# Patient Record
Sex: Male | Born: 2000 | Race: Black or African American | Hispanic: No | Marital: Single | Smoking: Never smoker
Health system: Southern US, Community
[De-identification: ages and names within clinical notes are randomized; demographics above are authoritative.]

## PROBLEM LIST (undated history)

## (undated) DIAGNOSIS — S060XAA Concussion with loss of consciousness status unknown, initial encounter: Secondary | ICD-10-CM

## (undated) HISTORY — PX: NO PAST SURGERIES: SHX2092

---
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
## 2021-12-11 ENCOUNTER — Other Ambulatory Visit (HOSPITAL_COMMUNITY): Payer: Self-pay | Admitting: Family Medicine

## 2021-12-11 ENCOUNTER — Other Ambulatory Visit: Payer: Self-pay | Admitting: Family Medicine

## 2021-12-11 ENCOUNTER — Ambulatory Visit (HOSPITAL_COMMUNITY): Payer: PRIVATE HEALTH INSURANCE

## 2021-12-11 DIAGNOSIS — S63649D Sprain of metacarpophalangeal joint of unspecified thumb, subsequent encounter: Secondary | ICD-10-CM

## 2021-12-14 ENCOUNTER — Encounter (HOSPITAL_COMMUNITY): Payer: Self-pay

## 2021-12-14 ENCOUNTER — Other Ambulatory Visit (HOSPITAL_COMMUNITY): Payer: PRIVATE HEALTH INSURANCE

## 2021-12-14 ENCOUNTER — Ambulatory Visit (HOSPITAL_COMMUNITY)
Admission: RE | Admit: 2021-12-14 | Discharge: 2021-12-14 | Disposition: A | Discharge status: Home / Self Care | Payer: PRIVATE HEALTH INSURANCE | Source: Ambulatory Visit | Attending: Family Medicine | Admitting: Family Medicine

## 2021-12-14 DIAGNOSIS — S63649D Sprain of metacarpophalangeal joint of unspecified thumb, subsequent encounter: Secondary | ICD-10-CM

## 2021-12-15 ENCOUNTER — Ambulatory Visit (HOSPITAL_COMMUNITY)
Admission: RE | Admit: 2021-12-15 | Discharge: 2021-12-15 | Disposition: A | Discharge status: Home / Self Care | Payer: No Typology Code available for payment source | Source: Ambulatory Visit | Attending: Family Medicine | Admitting: Family Medicine

## 2021-12-15 DIAGNOSIS — S63649D Sprain of metacarpophalangeal joint of unspecified thumb, subsequent encounter: Secondary | ICD-10-CM | POA: Insufficient documentation

## 2022-01-11 ENCOUNTER — Other Ambulatory Visit: Payer: Self-pay | Admitting: Orthopedic Surgery

## 2022-01-15 ENCOUNTER — Other Ambulatory Visit: Payer: Self-pay

## 2022-01-15 ENCOUNTER — Encounter (HOSPITAL_BASED_OUTPATIENT_CLINIC_OR_DEPARTMENT_OTHER): Payer: Self-pay | Admitting: Orthopedic Surgery

## 2022-01-21 NOTE — H&P (Signed)
History: CC / Reason for Visit: Left thumb HPI: This patient is a 21 year old, right-hand-dominant, male athlete who was playing soccer, slipped on the ground and landed injuring his left thumb.  He had x-rays which demonstrated no significant findings, but continued to have discomfort.  He was then sent on 12/15/2021 for an MRI of the left hand which indicated that he has a left thumb MCP UCL complete rupture with Stener lesion.  He is here today along with his dad to discuss surgical options.  The patient also was planning to play basketball in college and would need to be able to participate by end of October early November.  Past medical history, past surgical history, family history, social history, medications, allergies and review of systems are thoroughly reviewed by me, signed and scanned into SRS today.  The patient is otherwise healthy.  Exam:  Vitals: Refer to EMR. Constitutional:  WD, WN, NAD HEENT:  NCAT, EOMI Neuro/Psych:  Alert & oriented to person, place, and time; appropriate mood & affect Lymphatic: No generalized UE edema or lymphadenopathy Extremities / MSK:  Both UE are normal with respect to appearance, ranges of motion, joint stability, muscle strength/tone, sensation, & perfusion except as otherwise noted:  Left thumb is very swollen and mildly discolored still.  There is tenderness to palpation directly over the left MP joint line with laxity with radial stress applied gently.  He does have full IP range of motion.  The remaining digits are also full motion without swelling.    Labs / Xrays:  No radiographic studies obtained today.  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  Ultimately the patient as well as his father want to move forward with repair versus reconstruction of the left thumb MP UCL tear.  We discussed details  of the procedure, to include the use of the internal brace, likely not requiring autograft.  We discussed the advantages and disadvantages of proceeding more rapidly versus perhaps on the first day of September, as there are logistical reasons for them that that may be preferable.  I indicated it would not be optimal, but since it is already subacute, may not offer a lot of added difficulty or complication.  They will consider what they like to do and we will plan accordingly.    The details of the operative procedure were discussed with the patient.  Questions were invited and answered.  In addition to the goal of the procedure, the risks of the procedure to include but not limited to bleeding; infection; damage to the nerves or blood vessels that could result in bleeding, numbness, weakness, chronic pain, and the need for additional procedures; stiffness; the need for revision surgery; and anesthetic risks were reviewed.  No specific outcome was guaranteed or implied.  Informed consent was obtained.

## 2022-01-21 NOTE — Anesthesia Preprocedure Evaluation (Addendum)
Anesthesia Evaluation  Patient identified by MRN, date of birth, ID band Patient awake    Reviewed: Allergy & Precautions, NPO status , Patient's Chart, lab work & pertinent test results  Airway Mallampati: I       Dental no notable dental hx.    Pulmonary neg pulmonary ROS,    Pulmonary exam normal        Cardiovascular negative cardio ROS Normal cardiovascular exam     Neuro/Psych negative neurological ROS  negative psych ROS   GI/Hepatic negative GI ROS, Neg liver ROS,   Endo/Other  negative endocrine ROS  Renal/GU negative Renal ROS  negative genitourinary   Musculoskeletal   Abdominal Normal abdominal exam  (+)   Peds  Hematology   Anesthesia Other Findings   Reproductive/Obstetrics                            Anesthesia Physical Anesthesia Plan  ASA: 1  Anesthesia Plan: MAC and Regional   Post-op Pain Management: Regional block*   Induction:   PONV Risk Score and Plan: 1 and Ondansetron, Midazolam and Propofol infusion  Airway Management Planned: Natural Airway, Simple Face Mask and Nasal Cannula  Additional Equipment: None  Intra-op Plan:   Post-operative Plan:   Informed Consent: I have reviewed the patients History and Physical, chart, labs and discussed the procedure including the risks, benefits and alternatives for the proposed anesthesia with the patient or authorized representative who has indicated his/her understanding and acceptance.       Plan Discussed with: CRNA  Anesthesia Plan Comments:        Anesthesia Quick Evaluation

## 2022-01-22 ENCOUNTER — Ambulatory Visit (HOSPITAL_BASED_OUTPATIENT_CLINIC_OR_DEPARTMENT_OTHER): Payer: No Typology Code available for payment source | Admitting: Certified Registered"

## 2022-01-22 ENCOUNTER — Ambulatory Visit (HOSPITAL_COMMUNITY): Payer: No Typology Code available for payment source

## 2022-01-22 ENCOUNTER — Encounter (HOSPITAL_BASED_OUTPATIENT_CLINIC_OR_DEPARTMENT_OTHER)
Admission: RE | Disposition: A | Discharge status: Home / Self Care | Payer: Self-pay | Source: Home / Self Care | Attending: Orthopedic Surgery

## 2022-01-22 ENCOUNTER — Encounter (HOSPITAL_BASED_OUTPATIENT_CLINIC_OR_DEPARTMENT_OTHER): Payer: Self-pay | Admitting: Orthopedic Surgery

## 2022-01-22 ENCOUNTER — Ambulatory Visit (HOSPITAL_BASED_OUTPATIENT_CLINIC_OR_DEPARTMENT_OTHER)
Admission: RE | Admit: 2022-01-22 | Discharge: 2022-01-22 | Disposition: A | Discharge status: Home / Self Care | Payer: No Typology Code available for payment source | Attending: Orthopedic Surgery | Admitting: Orthopedic Surgery

## 2022-01-22 ENCOUNTER — Other Ambulatory Visit: Payer: Self-pay

## 2022-01-22 DIAGNOSIS — S5332XA Traumatic rupture of left ulnar collateral ligament, initial encounter: Secondary | ICD-10-CM | POA: Diagnosis not present

## 2022-01-22 DIAGNOSIS — S63642A Sprain of metacarpophalangeal joint of left thumb, initial encounter: Secondary | ICD-10-CM | POA: Insufficient documentation

## 2022-01-22 DIAGNOSIS — Y9366 Activity, soccer: Secondary | ICD-10-CM | POA: Insufficient documentation

## 2022-01-22 DIAGNOSIS — W010XXA Fall on same level from slipping, tripping and stumbling without subsequent striking against object, initial encounter: Secondary | ICD-10-CM | POA: Insufficient documentation

## 2022-01-22 HISTORY — PX: LIGAMENT REPAIR: SHX5444

## 2022-01-22 HISTORY — DX: Concussion with loss of consciousness status unknown, initial encounter: S06.0XAA

## 2022-01-22 SURGERY — REPAIR, LIGAMENT
Anesthesia: Monitor Anesthesia Care | Site: Thumb | Laterality: Left

## 2022-01-22 MED ORDER — DEXAMETHASONE SODIUM PHOSPHATE 10 MG/ML IJ SOLN
INTRAMUSCULAR | Status: DC | PRN
  Administered 2022-01-22: 10 mg

## 2022-01-22 MED ORDER — PROPOFOL 500 MG/50ML IV EMUL
INTRAVENOUS | Status: DC | PRN
  Administered 2022-01-22: 12 ug/kg/min via INTRAVENOUS

## 2022-01-22 MED ORDER — MIDAZOLAM HCL 2 MG/2ML IJ SOLN
INTRAMUSCULAR | Status: AC
  Filled 2022-01-22: qty 2

## 2022-01-22 MED ORDER — OXYCODONE HCL 5 MG PO TABS: 5.0000 mg | ORAL_TABLET | Freq: Four times a day (QID) | ORAL | 0 refills | Status: AC | PRN

## 2022-01-22 MED ORDER — MIDAZOLAM HCL 2 MG/2ML IJ SOLN
2.0000 mg | Freq: Once | INTRAMUSCULAR | Status: AC
Start: 2022-01-22 — End: 2022-01-22
  Administered 2022-01-22: 2 mg via INTRAVENOUS

## 2022-01-22 MED ORDER — IBUPROFEN 200 MG PO TABS: 600.0000 mg | ORAL_TABLET | Freq: Four times a day (QID) | ORAL | Status: AC

## 2022-01-22 MED ORDER — ROPIVACAINE HCL 7.5 MG/ML IJ SOLN
INTRAMUSCULAR | Status: DC | PRN
  Administered 2022-01-22 (×4): 5 mL via PERINEURAL

## 2022-01-22 MED ORDER — FENTANYL CITRATE (PF) 100 MCG/2ML IJ SOLN
100.0000 ug | Freq: Once | INTRAMUSCULAR | Status: AC
  Administered 2022-01-22: 100 ug via INTRAVENOUS

## 2022-01-22 MED ORDER — FENTANYL CITRATE (PF) 100 MCG/2ML IJ SOLN
INTRAMUSCULAR | Status: AC
  Filled 2022-01-22: qty 2

## 2022-01-22 MED ORDER — CEFAZOLIN SODIUM-DEXTROSE 2-4 GM/100ML-% IV SOLN
INTRAVENOUS | Status: AC
  Filled 2022-01-22: qty 100

## 2022-01-22 MED ORDER — LACTATED RINGERS IV SOLN: INTRAVENOUS | Status: DC

## 2022-01-22 MED ORDER — LIDOCAINE 2% (20 MG/ML) 5 ML SYRINGE
INTRAMUSCULAR | Status: DC | PRN
  Administered 2022-01-22: 30 mg via INTRAVENOUS

## 2022-01-22 MED ORDER — DEXMEDETOMIDINE (PRECEDEX) IN NS 20 MCG/5ML (4 MCG/ML) IV SYRINGE
PREFILLED_SYRINGE | INTRAVENOUS | Status: DC | PRN
  Administered 2022-01-22: 12 ug via INTRAVENOUS

## 2022-01-22 MED ORDER — ACETAMINOPHEN 325 MG PO TABS: 650.0000 mg | ORAL_TABLET | Freq: Four times a day (QID) | ORAL | Status: AC

## 2022-01-22 MED ORDER — CEFAZOLIN SODIUM-DEXTROSE 2-4 GM/100ML-% IV SOLN
2.0000 g | INTRAVENOUS | Status: AC
  Administered 2022-01-22: 2 g via INTRAVENOUS

## 2022-01-22 MED ORDER — ONDANSETRON HCL 4 MG/2ML IJ SOLN
INTRAMUSCULAR | Status: DC | PRN
  Administered 2022-01-22: 4 mg via INTRAVENOUS

## 2022-01-22 MED ORDER — CLONIDINE HCL (ANALGESIA) 100 MCG/ML EP SOLN
EPIDURAL | Status: DC | PRN
  Administered 2022-01-22: 100 ug

## 2022-01-22 SURGICAL SUPPLY — 69 items
ANCHOR REPAIR HAND WRIST (Anchor) IMPLANT
APL PRP STRL LF DISP 70% ISPRP (MISCELLANEOUS) ×1
BLADE MINI RND TIP GREEN BEAV (BLADE) IMPLANT
BLADE SURG 15 STRL LF DISP TIS (BLADE) ×1 IMPLANT
BLADE SURG 15 STRL SS (BLADE) ×2
BNDG CMPR 5X4 CHSV STRCH STRL (GAUZE/BANDAGES/DRESSINGS) ×1
BNDG CMPR 9X4 STRL LF SNTH (GAUZE/BANDAGES/DRESSINGS) ×1
BNDG COHESIVE 4X5 TAN STRL LF (GAUZE/BANDAGES/DRESSINGS) ×1 IMPLANT
BNDG ESMARK 4X9 LF (GAUZE/BANDAGES/DRESSINGS) ×1 IMPLANT
BNDG GAUZE DERMACEA FLUFF 4 (GAUZE/BANDAGES/DRESSINGS) ×1 IMPLANT
BNDG GZE DERMACEA 4 6PLY (GAUZE/BANDAGES/DRESSINGS) ×1
BRUSH SCRUB EZ PLAIN DRY (MISCELLANEOUS) IMPLANT
CHLORAPREP W/TINT 26 (MISCELLANEOUS) ×1 IMPLANT
CORD BIPOLAR FORCEPS 12FT (ELECTRODE) ×1 IMPLANT
COVER BACK TABLE 60X90IN (DRAPES) ×1 IMPLANT
COVER MAYO STAND STRL (DRAPES) ×1 IMPLANT
CUFF TOURN SGL QUICK 18X4 (TOURNIQUET CUFF) IMPLANT
CUFF TOURN SGL QUICK 24 (TOURNIQUET CUFF)
CUFF TRNQT CYL 24X4X16.5-23 (TOURNIQUET CUFF) IMPLANT
DRAPE C-ARM 42X72 X-RAY (DRAPES) ×1 IMPLANT
DRAPE EXTREMITY T 121X128X90 (DISPOSABLE) ×1 IMPLANT
DRAPE SURG 17X23 STRL (DRAPES) ×1 IMPLANT
DRSG ADAPTIC 3X8 NADH LF (GAUZE/BANDAGES/DRESSINGS) IMPLANT
DRSG EMULSION OIL 3X3 NADH (GAUZE/BANDAGES/DRESSINGS) IMPLANT
GAUZE SPONGE 4X4 12PLY STRL LF (GAUZE/BANDAGES/DRESSINGS) ×1 IMPLANT
GLOVE BIO SURGEON STRL SZ7.5 (GLOVE) ×1 IMPLANT
GLOVE BIOGEL PI IND STRL 7.0 (GLOVE) ×1 IMPLANT
GLOVE BIOGEL PI IND STRL 8 (GLOVE) ×1 IMPLANT
GLOVE BIOGEL PI INDICATOR 7.0 (GLOVE) ×1
GLOVE BIOGEL PI INDICATOR 8 (GLOVE) ×1
GLOVE ECLIPSE 6.5 STRL STRAW (GLOVE) ×1 IMPLANT
GOWN STRL REUS W/ TWL LRG LVL3 (GOWN DISPOSABLE) ×2 IMPLANT
GOWN STRL REUS W/ TWL XL LVL3 (GOWN DISPOSABLE) ×1 IMPLANT
GOWN STRL REUS W/TWL LRG LVL3 (GOWN DISPOSABLE) ×2
GOWN STRL REUS W/TWL XL LVL3 (GOWN DISPOSABLE) ×1
K-WIRE DBL .045X4 NSTRL (WIRE)
K-WIRE DBL .062X4 NSTRL (WIRE)
KWIRE DBL .045X4 NSTRL (WIRE) IMPLANT
KWIRE DBL .062X4 NSTRL (WIRE) IMPLANT
LOOP VESSEL MAXI BLUE (MISCELLANEOUS) IMPLANT
NDL 16GX1 1/2 (NEEDLE) IMPLANT
NDL HYPO 25X1 1.5 SAFETY (NEEDLE) IMPLANT
NEEDLE 16GX1 1/2 (NEEDLE) IMPLANT
NEEDLE HYPO 25X1 1.5 SAFETY (NEEDLE) IMPLANT
NS IRRIG 1000ML POUR BTL (IV SOLUTION) ×1 IMPLANT
PACK BASIN DAY SURGERY FS (CUSTOM PROCEDURE TRAY) ×1 IMPLANT
PADDING CAST ABS COTTON 4X4 ST (CAST SUPPLIES) IMPLANT
RETRIEVER SUT HEWSON (MISCELLANEOUS) IMPLANT
SLEEVE SCD COMPRESS KNEE MED (STOCKING) ×1 IMPLANT
SLING ARM FOAM STRAP LRG (SOFTGOODS) IMPLANT
SPLINT PLASTER CAST XFAST 3X15 (CAST SUPPLIES) ×1 IMPLANT
STOCKINETTE 6  STRL (DRAPES) ×1
STOCKINETTE 6 STRL (DRAPES) ×1 IMPLANT
SUCTION FRAZIER HANDLE 10FR (MISCELLANEOUS) ×1
SUCTION TUBE FRAZIER 10FR DISP (MISCELLANEOUS) ×1 IMPLANT
SUT ETHIBOND 3-0 V-5 (SUTURE) IMPLANT
SUT MERSILENE 4 0 P 3 (SUTURE) IMPLANT
SUT PDS AB 2-0 CT2 27 (SUTURE) IMPLANT
SUT STEEL 4 (SUTURE) IMPLANT
SUT VIC AB 2-0 CT3 27 (SUTURE) IMPLANT
SUT VICRYL 4-0 PS2 18IN ABS (SUTURE) IMPLANT
SUT VICRYL RAPIDE 4-0 (SUTURE) IMPLANT
SUT VICRYL RAPIDE 4/0 PS 2 (SUTURE) ×1 IMPLANT
SUT VICRYL+ 3-0 27IN RB-1 (SUTURE) IMPLANT
SYR 10ML LL (SYRINGE) IMPLANT
SYR BULB EAR ULCER 3OZ GRN STR (SYRINGE) ×1 IMPLANT
TOWEL GREEN STERILE FF (TOWEL DISPOSABLE) ×1 IMPLANT
TUBE CONNECTING 20X1/4 (TUBING) ×1 IMPLANT
UNDERPAD 30X36 HEAVY ABSORB (UNDERPADS AND DIAPERS) ×1 IMPLANT

## 2022-01-22 NOTE — Transfer of Care (Signed)
Immediate Anesthesia Transfer of Care Note  Patient: Kevin Green  Procedure(s) Performed: LEFT THUMB LIGAMENT REPAIR (Left: Thumb)  Patient Location: PACU  Anesthesia Type:MAC combined with regional for post-op pain  Level of Consciousness: awake, alert , oriented and patient cooperative  Airway & Oxygen Therapy: Patient Spontanous Breathing and Patient connected to face mask oxygen  Post-op Assessment: Report given to RN and Post -op Vital signs reviewed and stable  Post vital signs: Reviewed and stable  Last Vitals:  Vitals Value Taken Time  BP    Temp    Pulse 53 01/22/22 1052  Resp    SpO2 98 % 01/22/22 1052  Vitals shown include unvalidated device data.  Last Pain:  Vitals:   01/22/22 0753  TempSrc: Oral  PainSc: 0-No pain         Complications: No notable events documented.

## 2022-01-22 NOTE — Discharge Instructions (Addendum)
Post Anesthesia Home Care Instructions  Activity: Get plenty of rest for the remainder of the day. A responsible individual must stay with you for 24 hours following the procedure.  For the next 24 hours, DO NOT: -Drive a car -Advertising copywriter -Drink alcoholic beverages -Take any medication unless instructed by your physician -Make any legal decisions or sign important papers.  Meals: Start with liquid foods such as gelatin or soup. Progress to regular foods as tolerated. Avoid greasy, spicy, heavy foods. If nausea and/or vomiting occur, drink only clear liquids until the nausea and/or vomiting subsides. Call your physician if vomiting continues.  Special Instructions/Symptoms: Your throat may feel dry or sore from the anesthesia or the breathing tube placed in your throat during surgery. If this causes discomfort, gargle with warm salt water. The discomfort should disappear within 24 hours.      Regional Anesthesia Blocks  1. Numbness or the inability to move the "blocked" extremity may last from 3-48 hours after placement. The length of time depends on the medication injected and your individual response to the medication. If the numbness is not going away after 48 hours, call your surgeon.  2. The extremity that is blocked will need to be protected until the numbness is gone and the  Strength has returned. Because you cannot feel it, you will need to take extra care to avoid injury. Because it may be weak, you may have difficulty moving it or using it. You may not know what position it is in without looking at it while the block is in effect.  3. For blocks in the legs and feet, returning to weight bearing and walking needs to be done carefully. You will need to wait until the numbness is entirely gone and the strength has returned. You should be able to move your leg and foot normally before you try and bear weight or walk. You will need someone to be with you when you first try to  ensure you do not fall and possibly risk injury.  4. Bruising and tenderness at the needle site are common side effects and will resolve in a few days.  5. Persistent numbness or new problems with movement should be communicated to the surgeon or the [facility] [phone] [facility] [phone]).     Discharge Instructions   You have a dressing with a plaster splint incorporated in it. Move your fingers as much as possible, making a full fist and fully opening the fist. Elevate your hand to reduce pain & swelling of the digits.  Ice over the operative site may be helpful to reduce pain & swelling.  DO NOT USE HEAT. Pain medicine has been prescribed for you.  Take Tylenol 650 mg and Ibuprofen 600 mg OTC every 6 hours together.Take the pain medicine additionally for severe post operative pain as a rescue medicine.  Leave the dressing in place until you return to our office.  You may shower, but keep the bandage clean & dry.  You may drive a car when you are off of prescription pain medications and can safely control your vehicle with both hands. Call our office to schedule an appointment for 10-15 days from today.   Please call [phone] during normal business hours or [phone] after hours for any problems. Including the following:  - excessive redness of the incisions - drainage for more than 4 days - fever of more than 101.5 F  *Please note that pain medications will not  be refilled after hours or on weekends.

## 2022-01-22 NOTE — Anesthesia Postprocedure Evaluation (Signed)
Anesthesia Post Note  Patient: Kevin Green  Procedure(s) Performed: LEFT THUMB LIGAMENT REPAIR (Left: Thumb)     Patient location during evaluation: PACU Anesthesia Type: Regional and MAC Level of consciousness: awake Pain management: pain level controlled Vital Signs Assessment: post-procedure vital signs reviewed and stable Respiratory status: spontaneous breathing Cardiovascular status: stable Postop Assessment: no apparent nausea or vomiting Anesthetic complications: no   No notable events documented.  Last Vitals:  Vitals:   01/22/22 1100 01/22/22 1115  BP: (!) 109/37 (!) 110/44  Pulse: (!) 51 63  Resp: 17 16  Temp:    SpO2: 99% 99%    Last Pain:  Vitals:   01/22/22 1115  TempSrc:   PainSc: 0-No pain                 Huston Foley

## 2022-01-22 NOTE — Op Note (Signed)
01/22/2022  9:15 AM  PATIENT:  Kevin Green  21 y.o. male  PRE-OPERATIVE DIAGNOSIS: Left thumb MP UCL tear with suspected Stener lesion  POST-OPERATIVE DIAGNOSIS:  Same, with confirmed Stener lesion  PROCEDURE: Left thumb MP UCL repair  SURGEON: Rayvon Char. Grandville Silos, MD  PHYSICIAN ASSISTANT: Audelia Acton, PA-C  ANESTHESIA: Regional/MAC  SPECIMENS:  None  DRAINS:   None  EBL: Less than 10 mL  PREOPERATIVE INDICATIONS:  Kevin Green is a  21 y.o. male with a subcu left thumb MP injury, with a UCL tear and suspected Stener lesion  The risks benefits and alternatives were discussed with the patient preoperatively including but not limited to the risks of infection, bleeding, nerve injury, cardiopulmonary complications, the need for revision surgery, among others, and the patient verbalized understanding and consented to proceed.  OPERATIVE IMPLANTS: Arthrex 3.5 mm swivel lock anchors x2 with internal brace  OPERATIVE PROCEDURE:  After receiving prophylactic antibiotics and a regional block, the patient was escorted to the operative theatre and placed in a supine position.  A surgical "time-out" was performed during which the planned procedure, proposed operative site, and the correct patient identity were compared to the operative consent and agreement confirmed by the circulating nurse according to current facility policy.  Following application of a tourniquet to the operative extremity, the exposed skin was prepped with Chloraprep and draped in the usual sterile fashion.  The limb was exsanguinated with an Esmarch bandage and the tourniquet inflated to approximately 110mHg higher than systolic BP.  A sinusoidal shaped incision was fashioned and made over the ulnar aspect of the left thumb MP joint.  Full-thickness flaps were elevating with care to protect and preserve cutaneous nerve branches, elevating them and retracting them with the flaps.  The extensor retinaculum was divided  on the dorsal ulnar aspect of the joint and the plane between it and the underlying capsuloligamentous tissues were developed.  A capsulotomy was made in the same place, and the ligament was found, consistent with a typical Stener lesion, flipped back and lying alongside the metacarpal head.  It was teased free.  It had become a little bit globular, but there was enough sufficient span of it to think that it could be used in the repair.  The ligament was further teased back into place, scraping it slightly with the end of a Beaver blade.  The ulnar volar base of the proximal phalanx was prepared also with the end of the B[facility] Centerblade to create a raw surface for the ligament repair.  The Arthrex tunnels were then made first with a guidepin and then the 3 mm cannulated drill bit.  4-0 FiberWire was placed into the ligament with a running Krakw stitch up 1 side and down the other and then it was placed within the first anchor into the base of the proximal phalanx, with the joint held into slight flexion and also slightly ulnarly deviated.  Once this was done, the sutures were then cut from the construct, flush with the bone.  The internal brace which was anchored on the side was then brought back across over top of the ligament and it was separately tensioned and secured by placement of the swivel lock anchor into the metacarpal head.  This provided good stability for the joint.  The ligament lay in its native position.  Final images were obtained and the wound was irrigated.  The capsule was then repaired with 3-0 Vicryl running suture, which also included incorporation of the abductor  aponeurosis into the repair and the transverse retinaculum was repaired as well.  Decision was made not to additionally stabilize the construct with a K wire across the joint.  The thumb rested slightly flexed, with good restored collateral stability.  The tourniquet was released some additional hemostasis obtained with bipolar  electrocautery and then the skin was reapproximated with 4-0 Vicryl Rapide running horizontal mattress sutures.  A short arm thumb spica splint dressing was applied and he was taken to the recovery room in stable condition.  DISPOSITION: He will be discharged home today with typical instructions, returning in 10 to 15 days, with conversion to a short arm thumb spica cast for an additional couple weeks.

## 2022-01-22 NOTE — Interval H&P Note (Signed)
History and Physical Interval Note:  01/22/2022 9:15 AM  Kevin Green  has presented today for surgery, with the diagnosis of Left thumb sprain.  The various methods of treatment have been discussed with the patient and family. After consideration of risks, benefits and other options for treatment, the patient has consented to  Procedure(s) with comments: LEFT THUMB LIGAMENT REPAIR (Left) - PRE OP BLOCK as a surgical intervention.  The patient's history has been reviewed, patient examined, no change in status, stable for surgery.  I have reviewed the patient's chart and labs.  Questions were answered to the patient's satisfaction.     Jodi Marble

## 2022-01-22 NOTE — Anesthesia Procedure Notes (Signed)
Procedure Name: MAC Date/Time: 01/22/2022 9:48 AM  Performed by: Signe Colt, CRNAPre-anesthesia Checklist: Patient identified, Emergency Drugs available, Suction available, Patient being monitored and Timeout performed Patient Re-evaluated:Patient Re-evaluated prior to induction Oxygen Delivery Method: Simple face mask

## 2022-01-22 NOTE — Anesthesia Procedure Notes (Signed)
Anesthesia Regional Block: Supraclavicular block   Pre-Anesthetic Checklist: , timeout performed,  Correct Patient, Correct Site, Correct Laterality,  Correct Procedure, Correct Position, site marked,  Risks and benefits discussed,  Surgical consent,  Pre-op evaluation,  At surgeon's request and post-op pain management  Laterality: Upper and Left  Prep: chloraprep       Needles:  Injection technique: Single-shot  Needle Type: Echogenic Stimulator Needle     Needle Length: 9cm  Needle Gauge: 20   Needle insertion depth: 2 cm   Additional Needles:   Procedures:,,,, ultrasound used (permanent image in chart),,    Narrative:  Start time: 01/22/2022 8:25 AM End time: 01/22/2022 8:32 AM Injection made incrementally with aspirations every 5 mL.  Performed by: Personally  Anesthesiologist: Leilani Able, MD

## 2022-01-22 NOTE — Progress Notes (Signed)
Assisted Dr. Arby Barrette with left, supraclavicular, ultrasound guided block. Side rails up, monitors on throughout procedure. See vital signs in flow sheet. Tolerated Procedure well.

## 2022-01-25 ENCOUNTER — Encounter (HOSPITAL_BASED_OUTPATIENT_CLINIC_OR_DEPARTMENT_OTHER): Payer: Self-pay | Admitting: Orthopedic Surgery

## 2022-01-26 NOTE — Progress Notes (Signed)
Left message stating courtesy call and if any questions or concerns please call the doctors office.
